# Patient Record
Sex: Male | Born: 1945 | Race: White | Hispanic: No | Marital: Married
Health system: Southern US, Community
[De-identification: ages and names within clinical notes are randomized; demographics above are authoritative.]

## PROBLEM LIST (undated history)

## (undated) DIAGNOSIS — Z9109 Other allergy status, other than to drugs and biological substances: Secondary | ICD-10-CM

## (undated) DIAGNOSIS — I252 Old myocardial infarction: Secondary | ICD-10-CM

## (undated) DIAGNOSIS — I219 Acute myocardial infarction, unspecified: Secondary | ICD-10-CM

## (undated) DIAGNOSIS — I10 Essential (primary) hypertension: Secondary | ICD-10-CM

## (undated) DIAGNOSIS — I2510 Atherosclerotic heart disease of native coronary artery without angina pectoris: Secondary | ICD-10-CM

## (undated) HISTORY — PX: SALIVARY STONE REMOVAL: SHX5213

## (undated) HISTORY — PX: CARDIAC CATHETERIZATION: SHX172

---

## 2003-08-07 ENCOUNTER — Other Ambulatory Visit: Payer: Self-pay

## 2005-08-12 ENCOUNTER — Emergency Department: Payer: Self-pay | Admitting: Emergency Medicine

## 2007-08-31 ENCOUNTER — Emergency Department: Payer: Self-pay | Admitting: Emergency Medicine

## 2010-04-27 ENCOUNTER — Ambulatory Visit: Payer: Self-pay | Admitting: Internal Medicine

## 2019-12-04 ENCOUNTER — Other Ambulatory Visit: Payer: Self-pay

## 2019-12-04 ENCOUNTER — Ambulatory Visit: Payer: Medicare Other | Attending: Internal Medicine

## 2019-12-04 DIAGNOSIS — Z23 Encounter for immunization: Secondary | ICD-10-CM

## 2019-12-04 NOTE — Progress Notes (Signed)
   Covid-19 Vaccination Clinic  Name:  Douglas Figueroa    MRN: [id number] DOB: May 06, 1946  12/04/2019  Mr. Douglas Figueroa was observed post Covid-19 immunization for 15 minutes without incident. He was provided with Vaccine Information Sheet and instruction to access the V-Safe system.   Mr. Douglas Figueroa was instructed to call 911 with any severe reactions post vaccine: Marland Kitchen Difficulty breathing  . Swelling of face and throat  . A fast heartbeat  . A bad rash all over body  . Dizziness and weakness   Immunizations Administered    Name Date Dose VIS Date Route   Pfizer COVID-19 Vaccine 12/04/2019  9:07 AM 0.3 mL 09/12/2019 Intramuscular   Manufacturer: [employer], [employer]   Lot: [id number]   NDC: [address]-4

## 2019-12-27 ENCOUNTER — Ambulatory Visit: Payer: Medicare Other

## 2019-12-31 ENCOUNTER — Ambulatory Visit: Payer: Medicare Other | Attending: Internal Medicine

## 2019-12-31 DIAGNOSIS — Z23 Encounter for immunization: Secondary | ICD-10-CM

## 2019-12-31 NOTE — Progress Notes (Signed)
   Covid-19 Vaccination Clinic  Name:  Douglas Figueroa    MRN: [id number] DOB: [birth year]  12/31/2019  Mr. Douglas Figueroa was observed post Covid-19 immunization for 15 minutes without incident. He was provided with Vaccine Information Sheet and instruction to access the V-Safe system.   Douglas Figueroa was instructed to call 911 with any severe reactions post vaccine: Marland Kitchen Difficulty breathing  . Swelling of face and throat  . A fast heartbeat  . A bad rash all over body  . Dizziness and weakness   Immunizations Administered    Name Date Dose VIS Date Route   Pfizer COVID-19 Vaccine 12/31/2019  9:44 AM 0.3 mL 09/12/2019 Intramuscular   Manufacturer: [employer], [employer]   Lot: [id number]   NDC: [address]-3

## 2020-08-09 ENCOUNTER — Other Ambulatory Visit (HOSPITAL_COMMUNITY): Payer: Self-pay | Admitting: Orthopedic Surgery

## 2020-08-09 ENCOUNTER — Other Ambulatory Visit: Payer: Self-pay | Admitting: Orthopedic Surgery

## 2020-08-09 DIAGNOSIS — M4807 Spinal stenosis, lumbosacral region: Secondary | ICD-10-CM

## 2020-08-09 DIAGNOSIS — M5442 Lumbago with sciatica, left side: Secondary | ICD-10-CM

## 2020-08-23 ENCOUNTER — Other Ambulatory Visit: Payer: Self-pay

## 2020-08-23 ENCOUNTER — Ambulatory Visit
Admission: RE | Admit: 2020-08-23 | Discharge: 2020-08-23 | Disposition: A | Discharge status: Home / Self Care | Payer: Medicare Other | Source: Ambulatory Visit | Attending: Orthopedic Surgery | Admitting: Orthopedic Surgery

## 2020-08-23 DIAGNOSIS — M5442 Lumbago with sciatica, left side: Secondary | ICD-10-CM | POA: Diagnosis present

## 2020-08-23 DIAGNOSIS — M4807 Spinal stenosis, lumbosacral region: Secondary | ICD-10-CM | POA: Insufficient documentation

## 2022-02-01 ENCOUNTER — Ambulatory Visit
Admission: RE | Admit: 2022-02-01 | Discharge: 2022-02-01 | Disposition: A | Discharge status: Home / Self Care | Payer: Medicare Other | Source: Ambulatory Visit | Attending: Physician Assistant | Admitting: Physician Assistant

## 2022-02-01 ENCOUNTER — Other Ambulatory Visit: Payer: Self-pay | Admitting: Physician Assistant

## 2022-02-01 DIAGNOSIS — N492 Inflammatory disorders of scrotum: Secondary | ICD-10-CM | POA: Diagnosis not present

## 2022-02-02 ENCOUNTER — Ambulatory Visit: Payer: Self-pay | Admitting: Urology

## 2022-12-13 IMAGING — US US SCROTUM W/ DOPPLER COMPLETE
1 series · 15 of 25 positions shown · non-contrast
Comparison: None Available.

CLINICAL DATA: Pain and swelling

EXAM:
SCROTAL ULTRASOUND
DOPPLER ULTRASOUND OF THE TESTICLES
TECHNIQUE: Complete ultrasound examination of the testicles, epididymis, and
other scrotal structures was performed. Color and spectral Doppler
ultrasound were also utilized to evaluate blood flow to the
testicles.

[Series 1: us art/ven flow abd pelv doppl · 15 of 51 slices shown]
[im 1/51]
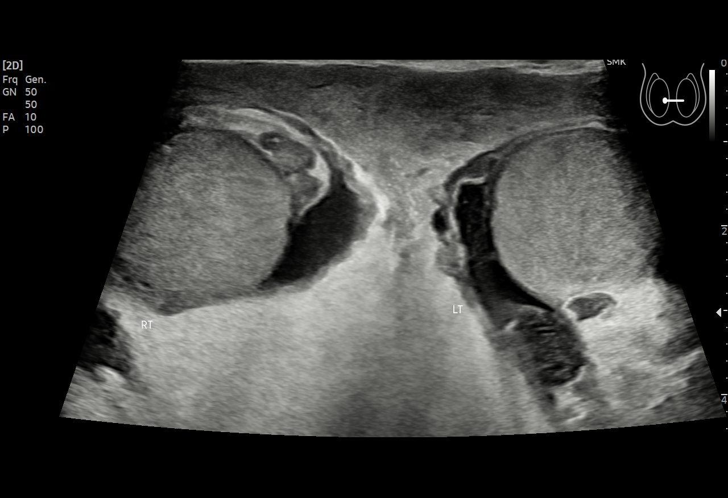
[im 5/51]
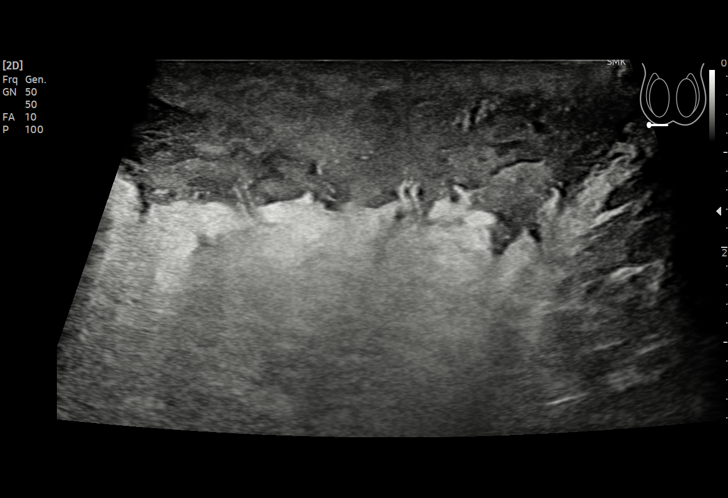
[im 9/51]
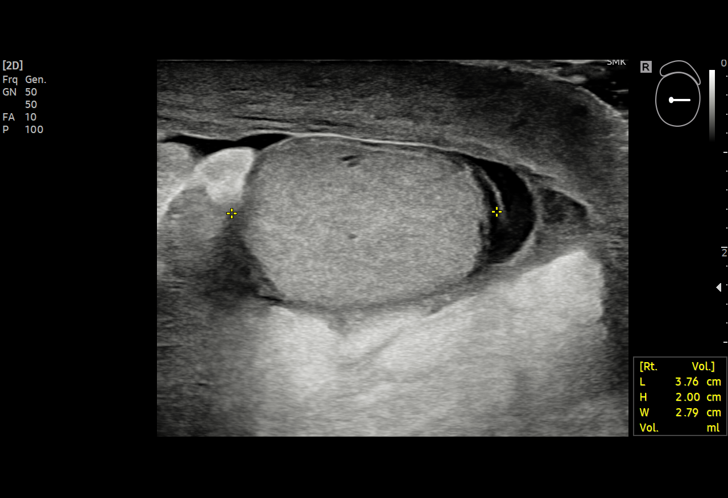
[im 11/51]
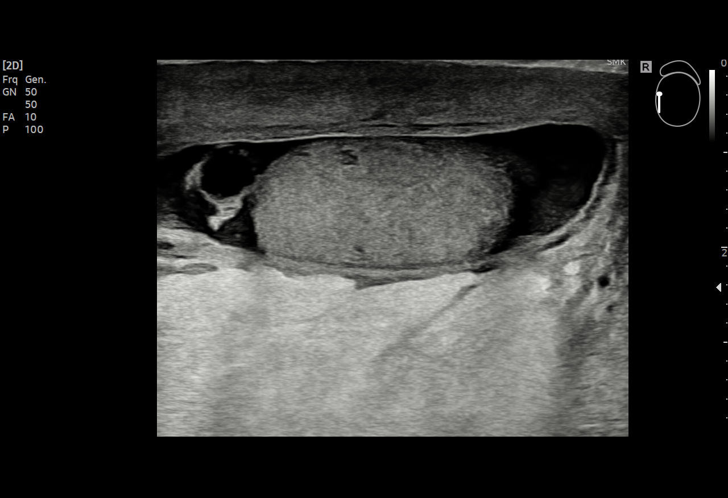
[im 15/51]
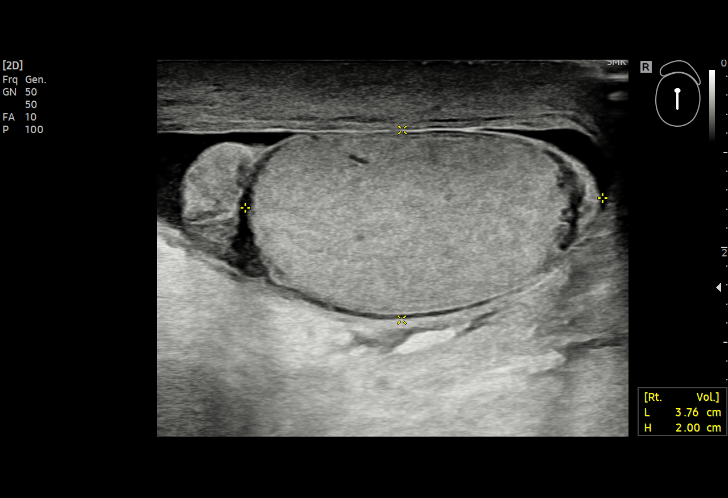
[im 19/51]
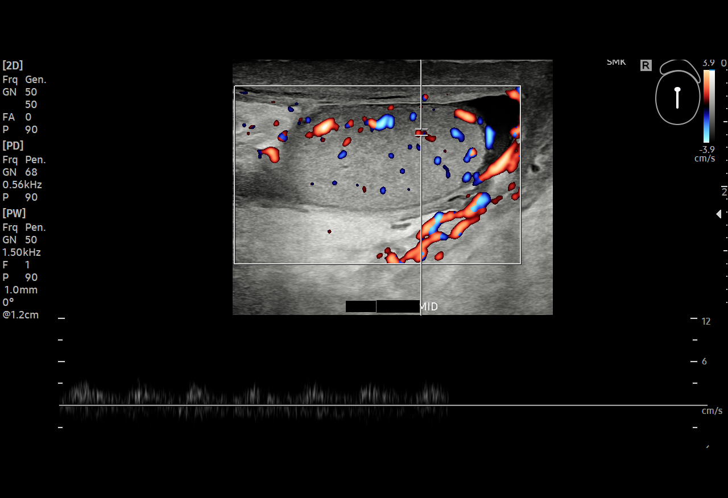
[im 21/51]
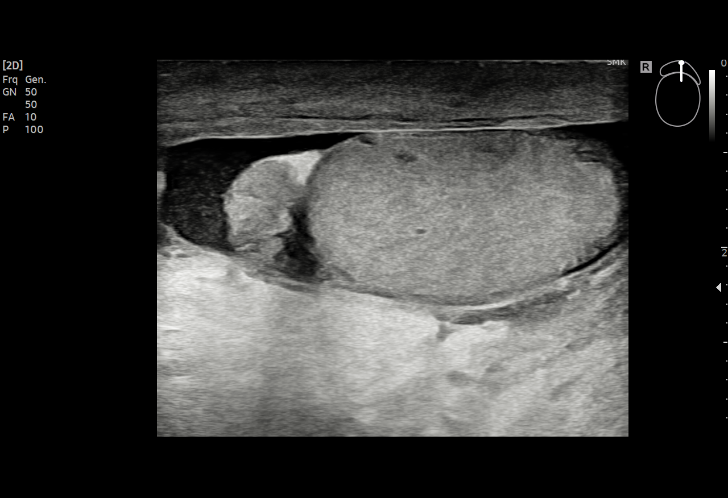
[im 26/51]
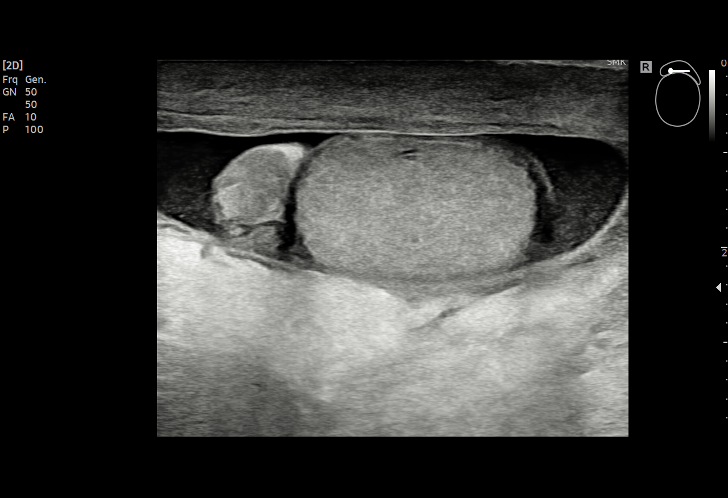
[im 30/51]
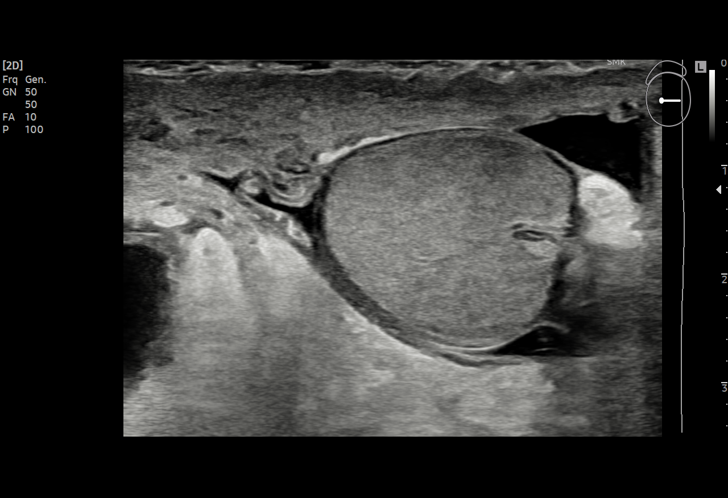
[im 32/51]
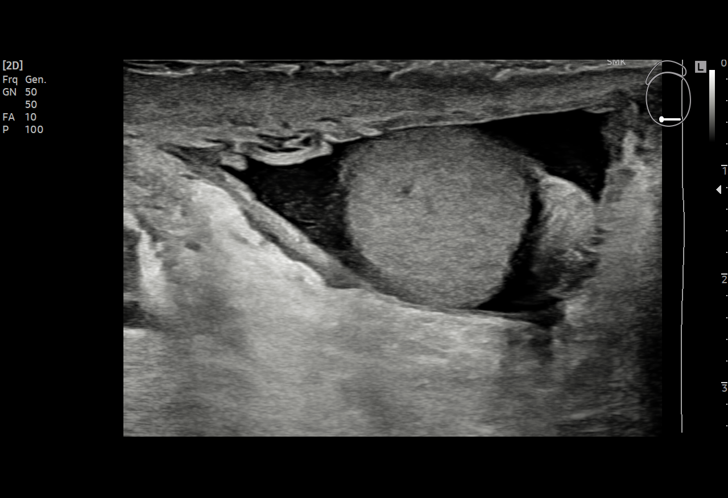
[im 36/51]
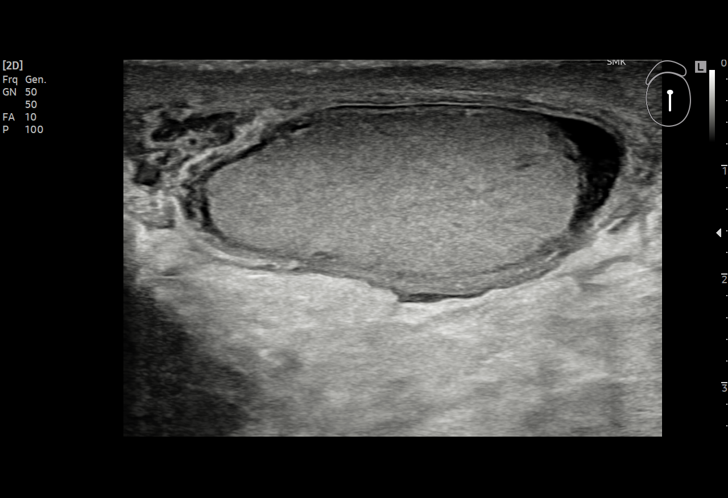
[im 40/51]
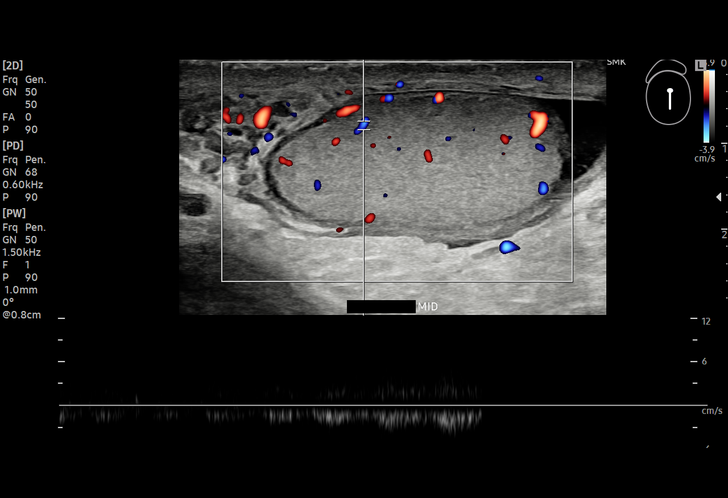
[im 42/51]
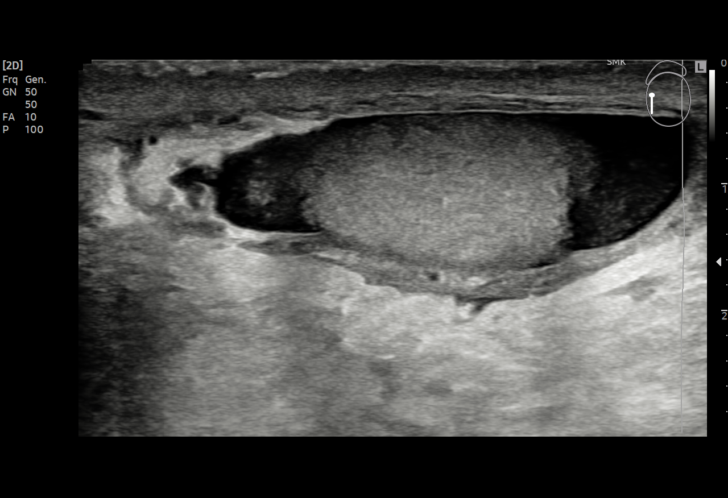
[im 46/51]
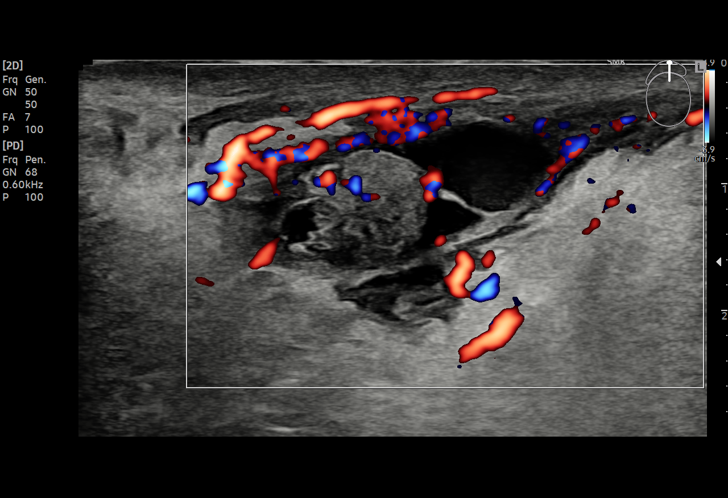
[im 51/51]
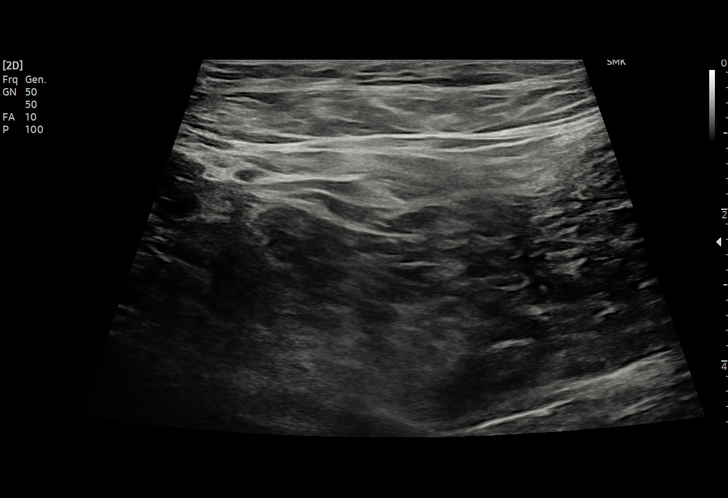

[15 of 25 positions shown; findings below may reference images not displayed]

FINDINGS: Right testicle

Measurements: 3.8 x 2 x 2.8 cm. No mass or microlithiasis
visualized.

Left testicle

Measurements: 3.5 x 1.5 x 2.3 cm. No mass or microlithiasis
visualized.

Right epididymis:  There is 8 mm cyst

Left epididymis:  There is 1.3 cm cyst

Hydrocele:  Small to moderate bilateral hydrocele is seen.

Varicocele:  None visualized.

Color Doppler examination was performed. Pulsed Doppler
interrogation of both testes demonstrates normal low resistance
arterial and venous waveforms bilaterally.

There is diffuse edema in the scrotal wall without any loculated
fluid collections. There is increased vascular flow in the scrotal
wall, possibly suggesting cellulitis.
IMPRESSION: There is no evidence of torsion in both testes. There is homogeneous
echogenicity in both testes.

Small to moderate bilateral hydrocele is present. Cysts are seen in
the epididymis on both sides.

There is diffuse subcutaneous edema in the scrotal wall suggesting
cellulitis. There is no loculated focal abscess in the scrotal wall.

## 2023-11-01 ENCOUNTER — Encounter: Payer: Self-pay | Admitting: Ophthalmology

## 2023-11-12 NOTE — Discharge Instructions (Signed)

## 2023-11-14 ENCOUNTER — Encounter
Admission: RE | Disposition: A | Discharge status: Home / Self Care | Payer: Self-pay | Source: Home / Self Care | Attending: Ophthalmology

## 2023-11-14 ENCOUNTER — Encounter: Payer: Self-pay | Admitting: Ophthalmology

## 2023-11-14 ENCOUNTER — Ambulatory Visit: Payer: Medicare Other | Admitting: Anesthesiology

## 2023-11-14 ENCOUNTER — Ambulatory Visit
Admission: RE | Admit: 2023-11-14 | Discharge: 2023-11-14 | Disposition: A | Discharge status: Home / Self Care | Payer: Medicare Other | Attending: Ophthalmology | Admitting: Ophthalmology

## 2023-11-14 ENCOUNTER — Other Ambulatory Visit: Payer: Self-pay

## 2023-11-14 DIAGNOSIS — I10 Essential (primary) hypertension: Secondary | ICD-10-CM | POA: Insufficient documentation

## 2023-11-14 DIAGNOSIS — I252 Old myocardial infarction: Secondary | ICD-10-CM | POA: Insufficient documentation

## 2023-11-14 DIAGNOSIS — I2510 Atherosclerotic heart disease of native coronary artery without angina pectoris: Secondary | ICD-10-CM | POA: Insufficient documentation

## 2023-11-14 DIAGNOSIS — H2512 Age-related nuclear cataract, left eye: Secondary | ICD-10-CM | POA: Insufficient documentation

## 2023-11-14 DIAGNOSIS — F17210 Nicotine dependence, cigarettes, uncomplicated: Secondary | ICD-10-CM | POA: Diagnosis not present

## 2023-11-14 HISTORY — DX: Essential (primary) hypertension: I10

## 2023-11-14 HISTORY — DX: Acute myocardial infarction, unspecified: I21.9

## 2023-11-14 HISTORY — DX: Other allergy status, other than to drugs and biological substances: Z91.09

## 2023-11-14 HISTORY — DX: Old myocardial infarction: I25.2

## 2023-11-14 HISTORY — PX: CATARACT EXTRACTION W/PHACO: SHX586

## 2023-11-14 HISTORY — DX: Atherosclerotic heart disease of native coronary artery without angina pectoris: I25.10

## 2023-11-14 SURGERY — PHACOEMULSIFICATION, CATARACT, WITH IOL INSERTION
Anesthesia: Monitor Anesthesia Care | Site: Eye | Laterality: Left

## 2023-11-14 MED ORDER — SIGHTPATH DOSE#1 BSS IO SOLN
INTRAOCULAR | Status: DC | PRN
  Administered 2023-11-14: 2 mL

## 2023-11-14 MED ORDER — FENTANYL CITRATE (PF) 100 MCG/2ML IJ SOLN
INTRAMUSCULAR | Status: AC
  Filled 2023-11-14: qty 2

## 2023-11-14 MED ORDER — SIGHTPATH DOSE#1 NA HYALUR & NA CHOND-NA HYALUR IO KIT
PACK | INTRAOCULAR | Status: DC | PRN
  Administered 2023-11-14: 1 via OPHTHALMIC

## 2023-11-14 MED ORDER — BRIMONIDINE TARTRATE-TIMOLOL 0.2-0.5 % OP SOLN
OPHTHALMIC | Status: DC | PRN
  Administered 2023-11-14: 1 [drp] via OPHTHALMIC

## 2023-11-14 MED ORDER — MIDAZOLAM HCL 2 MG/2ML IJ SOLN
INTRAMUSCULAR | Status: DC | PRN
  Administered 2023-11-14: 1 mg via INTRAVENOUS

## 2023-11-14 MED ORDER — TETRACAINE HCL 0.5 % OP SOLN
1.0000 [drp] | OPHTHALMIC | Status: DC | PRN
  Administered 2023-11-14 (×3): 1 [drp] via OPHTHALMIC

## 2023-11-14 MED ORDER — SIGHTPATH DOSE#1 BSS IO SOLN
INTRAOCULAR | Status: DC | PRN
  Administered 2023-11-14: 85 mL via OPHTHALMIC

## 2023-11-14 MED ORDER — MIDAZOLAM HCL 2 MG/2ML IJ SOLN
INTRAMUSCULAR | Status: AC
  Filled 2023-11-14: qty 2

## 2023-11-14 MED ORDER — SIGHTPATH DOSE#1 BSS IO SOLN
INTRAOCULAR | Status: DC | PRN
  Administered 2023-11-14: 15 mL via INTRAOCULAR

## 2023-11-14 MED ORDER — CEFUROXIME OPHTHALMIC INJECTION 1 MG/0.1 ML
INJECTION | OPHTHALMIC | Status: DC | PRN
  Administered 2023-11-14: 1 mg via INTRACAMERAL

## 2023-11-14 MED ORDER — TETRACAINE HCL 0.5 % OP SOLN
OPHTHALMIC | Status: AC
  Filled 2023-11-14: qty 4

## 2023-11-14 MED ORDER — FENTANYL CITRATE (PF) 100 MCG/2ML IJ SOLN
INTRAMUSCULAR | Status: DC | PRN
  Administered 2023-11-14: 50 ug via INTRAVENOUS

## 2023-11-14 MED ORDER — ARMC OPHTHALMIC DILATING DROPS
OPHTHALMIC | Status: AC
  Filled 2023-11-14: qty 0.5

## 2023-11-14 MED ORDER — ARMC OPHTHALMIC DILATING DROPS
1.0000 | OPHTHALMIC | Status: DC | PRN
  Administered 2023-11-14 (×3): 1 via OPHTHALMIC

## 2023-11-14 SURGICAL SUPPLY — 8 items
CATARACT SUITE SIGHTPATH (MISCELLANEOUS) ×1
FEE CATARACT SUITE SIGHTPATH (MISCELLANEOUS) ×1 IMPLANT
GLOVE SRG 8 PF TXTR STRL LF DI (GLOVE) ×1 IMPLANT
GLOVE SURG ENC TEXT LTX SZ7.5 (GLOVE) ×1 IMPLANT
LENS IOL TECNIS EYHANCE 18.5 (Intraocular Lens) IMPLANT
NDL FILTER BLUNT 18X1 1/2 (NEEDLE) ×1 IMPLANT
NEEDLE FILTER BLUNT 18X1 1/2 (NEEDLE) ×1
SYR 3ML LL SCALE MARK (SYRINGE) ×1 IMPLANT

## 2023-11-14 NOTE — Anesthesia Postprocedure Evaluation (Signed)
Anesthesia Post Note  Patient: Douglas Figueroa  Procedure(s) Performed: CATARACT EXTRACTION PHACO AND INTRAOCULAR LENS PLACEMENT (IOC) LEFT 10.99 00:43.7 (Left: Eye)  Patient location during evaluation: PACU Anesthesia Type: MAC Level of consciousness: awake and alert Pain management: pain level controlled Vital Signs Assessment: post-procedure vital signs reviewed and stable Respiratory status: spontaneous breathing, nonlabored ventilation, respiratory function stable and patient connected to nasal cannula oxygen Cardiovascular status: stable and blood pressure returned to baseline Postop Assessment: no apparent nausea or vomiting Anesthetic complications: no   No notable events documented.   Last Vitals:  Vitals:   11/14/23 1032 11/14/23 1034  BP:  116/72  Pulse: (!) 56 (!) 59  Resp: 13 14  Temp:    SpO2: 97% 95%    Last Pain:  Vitals:   11/14/23 1034  TempSrc:   PainSc: 0-No pain                 Harolyn Cocker C Analyssa Downs

## 2023-11-14 NOTE — Anesthesia Preprocedure Evaluation (Signed)
Anesthesia Evaluation  Patient identified by MRN, date of birth, ID band Patient awake    Reviewed: Allergy & Precautions, H&P , NPO status , Patient's Chart, lab work & pertinent test results  Airway Mallampati: III  TM Distance: >3 FB Neck ROM: Full    Dental no notable dental hx. (+) Poor Dentition, Chipped   Pulmonary neg pulmonary ROS, Current Smoker and Patient abstained from smoking.   Pulmonary exam normal breath sounds clear to auscultation       Cardiovascular hypertension, + CAD and + Past MI  negative cardio ROS Normal cardiovascular exam Rhythm:Regular Rate:Normal   ETT Myoview on 10/25/2021, exercising for 4 minutes on a Bruce protocol without chest pain or ischemic ECG changes. Gated scintigraphy revealed LVEF 55%. SPECT imaging revealed no evidence for ischemia, but did show a mild inferior scar.    Neuro/Psych negative neurological ROS  negative psych ROS   GI/Hepatic negative GI ROS, Neg liver ROS,,,  Endo/Other  negative endocrine ROS    Renal/GU negative Renal ROS  negative genitourinary   Musculoskeletal negative musculoskeletal ROS (+)    Abdominal   Peds negative pediatric ROS (+)  Hematology negative hematology ROS (+)   Anesthesia Other Findings Hypertension  Myocardial infarction (HCC) Environmental allergies  CAD S/P percutaneous coronary angioplasty History of ST elevation myocardial infarction (STEMI)     Reproductive/Obstetrics negative OB ROS                              Anesthesia Physical Anesthesia Plan  ASA: 3  Anesthesia Plan: MAC   Post-op Pain Management:    Induction: Intravenous  PONV Risk Score and Plan:   Airway Management Planned: Natural Airway and Nasal Cannula  Additional Equipment:   Intra-op Plan:   Post-operative Plan:   Informed Consent: I have reviewed the patients History and Physical, chart, labs and discussed the  procedure including the risks, benefits and alternatives for the proposed anesthesia with the patient or authorized representative who has indicated his/her understanding and acceptance.     Dental Advisory Given  Plan Discussed with: Anesthesiologist, CRNA and Surgeon  Anesthesia Plan Comments: (Patient consented for risks of anesthesia including but not limited to:  - adverse reactions to medications - damage to eyes, teeth, lips or other oral mucosa - nerve damage due to positioning  - sore throat or hoarseness - Damage to heart, brain, nerves, lungs, other parts of body or loss of life  Patient voiced understanding and assent.)         Anesthesia Quick Evaluation

## 2023-11-14 NOTE — Transfer of Care (Signed)
Immediate Anesthesia Transfer of Care Note  Patient: Douglas Figueroa  Procedure(s) Performed: CATARACT EXTRACTION PHACO AND INTRAOCULAR LENS PLACEMENT (IOC) LEFT 10.99 00:43.7 (Left: Eye)  Patient Location: PACU  Anesthesia Type: MAC  Level of Consciousness: awake, alert  and patient cooperative  Airway and Oxygen Therapy: Patient Spontanous Breathing and Patient connected to supplemental oxygen  Post-op Assessment: Post-op Vital signs reviewed, Patient's Cardiovascular Status Stable, Respiratory Function Stable, Patent Airway and No signs of Nausea or vomiting  Post-op Vital Signs: Reviewed and stable  Complications: No notable events documented.

## 2023-11-14 NOTE — Op Note (Signed)
OPERATIVE NOTE  Douglas Figueroa [id number] 11/14/2023   PREOPERATIVE DIAGNOSIS:  Nuclear sclerotic cataract left eye. H25.12   POSTOPERATIVE DIAGNOSIS:    Nuclear sclerotic cataract left eye.     PROCEDURE:  Phacoemusification with posterior chamber intraocular lens placement of the left eye  Ultrasound time: Procedure(s): CATARACT EXTRACTION PHACO AND INTRAOCULAR LENS PLACEMENT (IOC) LEFT 10.99 00:43.7 (Left)  LENS:   Implant Name Type Inv. Item Serial No. Manufacturer Lot No. LRB No. Used Action  LENS IOL TECNIS EYHANCE 18.5 - [id number] Intraocular Lens LENS IOL TECNIS EYHANCE 18.5 [id number] [employer]  Left 1 Implanted      SURGEON:  Douglas Evener, MD   ANESTHESIA:  Topical with tetracaine drops and 2% Xylocaine jelly, augmented with 1% preservative-free intracameral lidocaine.    COMPLICATIONS:  None.   DESCRIPTION OF PROCEDURE:  The patient was identified in the holding room and transported to the operating room and placed in the supine position under the operating microscope.  The left eye was identified as the operative eye and it was prepped and draped in the usual sterile ophthalmic fashion.   A 1 millimeter clear-corneal paracentesis was made at the 1:30 position.  0.5 ml of preservative-free 1% lidocaine was injected into the anterior chamber.  The anterior chamber was filled with Viscoat viscoelastic.  A 2.4 millimeter keratome was used to make a near-clear corneal incision at the 10:30 position.  .  A curvilinear capsulorrhexis was made with a cystotome and capsulorrhexis forceps.  Balanced salt solution was used to hydrodissect and hydrodelineate the nucleus.   Phacoemulsification was then used in stop and chop fashion to remove the lens nucleus and epinucleus.  The remaining cortex was then removed using the irrigation and aspiration handpiece. Provisc was then placed into the capsular bag to distend it for lens placement.  A lens was then injected into the  capsular bag.  The remaining viscoelastic was aspirated.   Wounds were hydrated with balanced salt solution.  The anterior chamber was inflated to a physiologic pressure with balanced salt solution.  No wound leaks were noted. Cefuroxime 0.1 ml of a 10mg /ml solution was injected into the anterior chamber for a dose of 1 mg of intracameral antibiotic at the completion of the case.   Timolol and Brimonidine drops were applied to the eye.  The patient was taken to the recovery room in stable condition without complications of anesthesia or surgery.  [name] 11/14/2023, 10:30 AM

## 2023-11-14 NOTE — H&P (Signed)
[facility]   Primary Care Physician:  Marisue Ivan, MD Ophthalmologist: Dr. Lockie Mola  Pre-Procedure History & Physical: HPI:  Douglas Figueroa is a 78 y.o. male here for ophthalmic surgery.   Past Medical History:  Diagnosis Date   CAD S/P percutaneous coronary angioplasty    Environmental allergies    History of ST elevation myocardial infarction (STEMI)    Hypertension    Myocardial infarction [facility])    STEMI - 2000, 2002    Past Surgical History:  Procedure Laterality Date   CARDIAC CATHETERIZATION     2000, 2002 . stents placed   SALIVARY STONE REMOVAL      Prior to Admission medications   Medication Sig Start Date End Date Taking? Authorizing Provider  adalimumab (HUMIRA, 2 PEN,) 40 MG/0.4ML pen Inject into the skin every 14 (fourteen) days.   Yes [provider]  aspirin EC 81 MG tablet Take 81 mg by mouth in the morning and at bedtime. Swallow whole.   Yes [provider]  augmented betamethasone dipropionate (DIPROLENE-AF) 0.05 % ointment Apply topically 2 (two) times daily.   Yes [provider]  captopril (CAPOTEN) 12.5 MG tablet Take 12.5 mg by mouth 2 (two) times daily.   Yes [provider]  fluocinonide (LIDEX) 0.05 % external solution Apply 1 Application topically 2 (two) times daily.   Yes [provider]  KETOCONAZOLE EX Apply topically in the morning and at bedtime.   Yes [provider]  metoprolol tartrate (LOPRESSOR) 25 MG tablet Take 25 mg by mouth 2 (two) times daily.   Yes [provider]  nitroGLYCERIN (NITROSTAT) 0.4 MG SL tablet Place 0.4 mg under the tongue every 5 (five) minutes as needed for chest pain.   Yes [provider]  simvastatin (ZOCOR) 40 MG tablet Take 40 mg by mouth daily. Patient not taking: Reported on 11/14/2023    [provider]    Allergies as of 10/18/2023   (Not on File)    History reviewed. No pertinent family  history.  Social History   Socioeconomic History   Marital status: Married    Spouse name: Not on file   Number of children: Not on file   Years of education: Not on file   Highest education level: Not on file  Occupational History   Not on file  Tobacco Use   Smoking status: Some Days    Current packs/day: 0.10    Average packs/day: 0.9 packs/day for 60.1 years (53.7 ttl pk-yrs)    Types: Cigarettes    Start date: 1965   Smokeless tobacco: Never  Vaping Use   Vaping status: Never Used  Substance and Sexual Activity   Alcohol use: Yes    Comment: Rare   Drug use: Not on file   Sexual activity: Not on file  Other Topics Concern   Not on file  Social History Narrative   Not on file   Social Drivers of Health   Financial Resource Strain: Not on file  Food Insecurity: Not on file  Transportation Needs: Not on file  Physical Activity: Not on file  Stress: Not on file  Social Connections: Not on file  Intimate Partner Violence: Not on file    Review of Systems: See HPI, otherwise negative ROS  Physical Exam: BP 115/66   Pulse (!) 59   Temp (!) 97.3 F (36.3 C) (Temporal)   Resp 13   Ht 6' 0.01" (1.829 m)   Wt 79.8 kg  SpO2 94%   BMI 23.86 kg/m  General:   Alert,  pleasant and cooperative in NAD Head:  Normocephalic and atraumatic. Lungs:  Clear to auscultation.    Heart:  Regular rate and rhythm.   Impression/Plan: Douglas Figueroa is here for ophthalmic surgery.  Risks, benefits, limitations, and alternatives regarding ophthalmic surgery have been reviewed with the patient.  Questions have been answered.  All parties agreeable.   Lockie Mola, MD  11/14/2023, 9:16 AM

## 2023-11-15 ENCOUNTER — Encounter: Payer: Self-pay | Admitting: Ophthalmology

## 2023-11-15 DIAGNOSIS — H2512 Age-related nuclear cataract, left eye: Secondary | ICD-10-CM | POA: Diagnosis not present

## 2023-11-21 NOTE — Anesthesia Preprocedure Evaluation (Addendum)
 Anesthesia Evaluation  Patient identified by MRN, date of birth, ID band Patient awake    Reviewed: Allergy & Precautions, H&P , NPO status , Patient's Chart, lab work & pertinent test results  Airway Mallampati: III  TM Distance: >3 FB Neck ROM: Full    Dental no notable dental hx. (+) Chipped, Poor Dentition   Pulmonary neg pulmonary ROS, Current Smoker and Patient abstained from smoking.   Pulmonary exam normal breath sounds clear to auscultation       Cardiovascular hypertension, + CAD and + Past MI  negative cardio ROS Normal cardiovascular exam Rhythm:Regular Rate:Normal  ETT Myoview on 10/25/2021, exercising for 4 minutes on a Bruce protocol without chest pain or ischemic ECG changes. Gated scintigraphy revealed LVEF 55%. SPECT imaging revealed no evidence for ischemia, but did show a mild inferior scar.        Neuro/Psych negative neurological ROS  negative psych ROS   GI/Hepatic negative GI ROS, Neg liver ROS,,,  Endo/Other  negative endocrine ROS    Renal/GU negative Renal ROS  negative genitourinary   Musculoskeletal negative musculoskeletal ROS (+)    Abdominal   Peds negative pediatric ROS (+)  Hematology negative hematology ROS (+)   Anesthesia Other Findings Previous cataract surgery 11-14-23 Dr. Juel Burrow anesthesiologist  Hypertension  Myocardial infarction [facility]) Environmental allergies  CAD  S/P percutaneous coronary angioplasty History of ST elevation myocardial infarction (STEMI)     Reproductive/Obstetrics negative OB ROS                             Anesthesia Physical Anesthesia Plan  ASA: 3  Anesthesia Plan: MAC   Post-op Pain Management:    Induction: Intravenous  PONV Risk Score and Plan:   Airway Management Planned: Natural Airway and Nasal Cannula  Additional Equipment:   Intra-op Plan:   Post-operative Plan:   Informed Consent: I have  reviewed the patients History and Physical, chart, labs and discussed the procedure including the risks, benefits and alternatives for the proposed anesthesia with the patient or authorized representative who has indicated his/her understanding and acceptance.     Dental Advisory Given  Plan Discussed with: Anesthesiologist, CRNA and Surgeon  Anesthesia Plan Comments: (Patient consented for risks of anesthesia including but not limited to:  - adverse reactions to medications - damage to eyes, teeth, lips or other oral mucosa - nerve damage due to positioning  - sore throat or hoarseness - Damage to heart, brain, nerves, lungs, other parts of body or loss of life  Patient voiced understanding and assent.)       Anesthesia Quick Evaluation

## 2023-12-03 NOTE — Discharge Instructions (Signed)

## 2023-12-05 ENCOUNTER — Ambulatory Visit: Payer: Self-pay | Admitting: Anesthesiology

## 2023-12-05 ENCOUNTER — Other Ambulatory Visit: Payer: Self-pay

## 2023-12-05 ENCOUNTER — Encounter: Payer: Self-pay | Admitting: Ophthalmology

## 2023-12-05 ENCOUNTER — Ambulatory Visit
Admission: RE | Admit: 2023-12-05 | Discharge: 2023-12-05 | Disposition: A | Discharge status: Home / Self Care | Payer: Medicare Other | Attending: Ophthalmology | Admitting: Ophthalmology

## 2023-12-05 ENCOUNTER — Encounter
Admission: RE | Disposition: A | Discharge status: Home / Self Care | Payer: Self-pay | Source: Home / Self Care | Attending: Ophthalmology

## 2023-12-05 DIAGNOSIS — I10 Essential (primary) hypertension: Secondary | ICD-10-CM | POA: Diagnosis not present

## 2023-12-05 DIAGNOSIS — H2511 Age-related nuclear cataract, right eye: Secondary | ICD-10-CM | POA: Diagnosis present

## 2023-12-05 DIAGNOSIS — I252 Old myocardial infarction: Secondary | ICD-10-CM | POA: Insufficient documentation

## 2023-12-05 DIAGNOSIS — Z955 Presence of coronary angioplasty implant and graft: Secondary | ICD-10-CM | POA: Diagnosis not present

## 2023-12-05 DIAGNOSIS — I2510 Atherosclerotic heart disease of native coronary artery without angina pectoris: Secondary | ICD-10-CM | POA: Diagnosis not present

## 2023-12-05 DIAGNOSIS — F17210 Nicotine dependence, cigarettes, uncomplicated: Secondary | ICD-10-CM | POA: Insufficient documentation

## 2023-12-05 HISTORY — PX: CATARACT EXTRACTION W/PHACO: SHX586

## 2023-12-05 SURGERY — PHACOEMULSIFICATION, CATARACT, WITH IOL INSERTION
Anesthesia: Monitor Anesthesia Care | Site: Eye | Laterality: Right

## 2023-12-05 MED ORDER — BRIMONIDINE TARTRATE-TIMOLOL 0.2-0.5 % OP SOLN
OPHTHALMIC | Status: DC | PRN
  Administered 2023-12-05: 1 [drp] via OPHTHALMIC

## 2023-12-05 MED ORDER — ARMC OPHTHALMIC DILATING DROPS
1.0000 | OPHTHALMIC | Status: DC | PRN
  Administered 2023-12-05 (×3): 1 via OPHTHALMIC

## 2023-12-05 MED ORDER — MIDAZOLAM HCL 2 MG/2ML IJ SOLN
INTRAMUSCULAR | Status: AC
Start: 2023-12-05 — End: ?
  Filled 2023-12-05: qty 2

## 2023-12-05 MED ORDER — MIDAZOLAM HCL 2 MG/2ML IJ SOLN
INTRAMUSCULAR | Status: DC | PRN
  Administered 2023-12-05: 1 mg via INTRAVENOUS

## 2023-12-05 MED ORDER — SIGHTPATH DOSE#1 NA HYALUR & NA CHOND-NA HYALUR IO KIT
PACK | INTRAOCULAR | Status: DC | PRN
  Administered 2023-12-05: 1 via OPHTHALMIC

## 2023-12-05 MED ORDER — ARMC OPHTHALMIC DILATING DROPS
OPHTHALMIC | Status: AC
  Filled 2023-12-05: qty 0.5

## 2023-12-05 MED ORDER — CEFUROXIME OPHTHALMIC INJECTION 1 MG/0.1 ML
INJECTION | OPHTHALMIC | Status: DC | PRN
  Administered 2023-12-05: .1 mL via INTRACAMERAL

## 2023-12-05 MED ORDER — FENTANYL CITRATE (PF) 100 MCG/2ML IJ SOLN
INTRAMUSCULAR | Status: AC
  Filled 2023-12-05: qty 2

## 2023-12-05 MED ORDER — SIGHTPATH DOSE#1 BSS IO SOLN
INTRAOCULAR | Status: DC | PRN
  Administered 2023-12-05: 77 mL via OPHTHALMIC

## 2023-12-05 MED ORDER — TETRACAINE HCL 0.5 % OP SOLN
OPHTHALMIC | Status: AC
  Filled 2023-12-05: qty 4

## 2023-12-05 MED ORDER — TETRACAINE HCL 0.5 % OP SOLN
1.0000 [drp] | OPHTHALMIC | Status: DC | PRN
  Administered 2023-12-05 (×3): 1 [drp] via OPHTHALMIC

## 2023-12-05 MED ORDER — FENTANYL CITRATE (PF) 100 MCG/2ML IJ SOLN
INTRAMUSCULAR | Status: DC | PRN
  Administered 2023-12-05: 50 ug via INTRAVENOUS

## 2023-12-05 MED ORDER — SIGHTPATH DOSE#1 BSS IO SOLN
INTRAOCULAR | Status: DC | PRN
  Administered 2023-12-05: 1 mL

## 2023-12-05 MED ORDER — SIGHTPATH DOSE#1 BSS IO SOLN
INTRAOCULAR | Status: DC | PRN
  Administered 2023-12-05: 15 mL

## 2023-12-05 SURGICAL SUPPLY — 20 items
BNDG EYE OVAL 2 1/8 X 2 5/8 (GAUZE/BANDAGES/DRESSINGS) IMPLANT
CANNULA ANT/CHMB 27G (MISCELLANEOUS) IMPLANT
CANNULA ANT/CHMB 27GA (MISCELLANEOUS) IMPLANT
CATARACT SUITE SIGHTPATH (MISCELLANEOUS) ×1 IMPLANT
FEE CATARACT SUITE SIGHTPATH (MISCELLANEOUS) ×1 IMPLANT
GLOVE BIOGEL PI IND STRL 8 (GLOVE) ×1 IMPLANT
GLOVE PI ULTRA LF STRL 7.5 (GLOVE) IMPLANT
GLOVE SURG LX STRL 7.5 STRW (GLOVE) ×1 IMPLANT
GLOVE SURG PROTEXIS BL SZ6.5 (GLOVE) ×1 IMPLANT
GLOVE SURG SYN 6.5 PF PI BL (GLOVE) ×1 IMPLANT
LENS IOL TECNIS EYHANCE 18.5 (Intraocular Lens) IMPLANT
NDL FILTER BLUNT 18X1 1/2 (NEEDLE) ×1 IMPLANT
NDL RETROBULBAR .5 NSTRL (NEEDLE) IMPLANT
NEEDLE FILTER BLUNT 18X1 1/2 (NEEDLE) ×1 IMPLANT
PACK VIT ANT 23G (MISCELLANEOUS) IMPLANT
RING MALYGIN 7.0 (MISCELLANEOUS) IMPLANT
SUT ETHILON 10-0 CS-B-6CS-B-6 (SUTURE) IMPLANT
SUT VICRYL 9 0 (SUTURE) IMPLANT
SUTURE EHLN 10-0 CS-B-6CS-B-6 (SUTURE) IMPLANT
SYR 3ML LL SCALE MARK (SYRINGE) ×1 IMPLANT

## 2023-12-05 NOTE — Anesthesia Postprocedure Evaluation (Signed)
 Anesthesia Post Note  Patient: Douglas Figueroa  Procedure(s) Performed: CATARACT EXTRACTION PHACO AND INTRAOCULAR LENS PLACEMENT (IOC) RIGHT (Right: Eye)  Patient location during evaluation: PACU Anesthesia Type: MAC Level of consciousness: awake and alert Pain management: pain level controlled Vital Signs Assessment: post-procedure vital signs reviewed and stable Respiratory status: spontaneous breathing, nonlabored ventilation, respiratory function stable and patient connected to nasal cannula oxygen Cardiovascular status: stable and blood pressure returned to baseline Postop Assessment: no apparent nausea or vomiting Anesthetic complications: no   No notable events documented.   Last Vitals:  Vitals:   12/05/23 0820 12/05/23 0825  BP: 114/70 119/69  Pulse: (!) 58 (!) 59  Resp: 13 14  Temp: (!) 36.4 C 36.4 C  SpO2: 97% 97%    Last Pain:  Vitals:   12/05/23 0825  TempSrc:   PainSc: 0-No pain                 [place] Kuang C China Deitrick

## 2023-12-05 NOTE — H&P (Signed)
 [facility]   Primary Care Physician:  Marisue Ivan, MD Ophthalmologist: Dr. Lockie Mola  Pre-Procedure History & Physical: HPI:  ISAISH ALEMU is a 78 y.o. male here for ophthalmic surgery.   Past Medical History:  Diagnosis Date   CAD S/P percutaneous coronary angioplasty    Environmental allergies    History of ST elevation myocardial infarction (STEMI)    Hypertension    Myocardial infarction [facility])    STEMI - 2000, 2002    Past Surgical History:  Procedure Laterality Date   CARDIAC CATHETERIZATION     2000, 2002 . stents placed   CATARACT EXTRACTION W/PHACO Left 11/14/2023   Procedure: CATARACT EXTRACTION PHACO AND INTRAOCULAR LENS PLACEMENT (IOC) LEFT 10.99 00:43.7;  Surgeon: Lockie Mola, MD;  [facility];  Service: Ophthalmology;  Laterality: Left;   SALIVARY STONE REMOVAL      Prior to Admission medications   Medication Sig Start Date End Date Taking? Authorizing Provider  aspirin EC 81 MG tablet Take 81 mg by mouth in the morning and at bedtime. Swallow whole.   Yes [provider]  augmented betamethasone dipropionate (DIPROLENE-AF) 0.05 % ointment Apply topically 2 (two) times daily.   Yes [provider]  captopril (CAPOTEN) 12.5 MG tablet Take 12.5 mg by mouth 2 (two) times daily.   Yes [provider]  fluocinonide (LIDEX) 0.05 % external solution Apply 1 Application topically 2 (two) times daily.   Yes [provider]  KETOCONAZOLE EX Apply topically in the morning and at bedtime.   Yes [provider]  metoprolol tartrate (LOPRESSOR) 25 MG tablet Take 25 mg by mouth 2 (two) times daily.   Yes [provider]  nitroGLYCERIN (NITROSTAT) 0.4 MG SL tablet Place 0.4 mg under the tongue every 5 (five) minutes as needed for chest pain.   Yes [provider]  simvastatin (ZOCOR) 40 MG tablet Take 40 mg by mouth daily.   Yes [provider]  adalimumab  (HUMIRA, 2 PEN,) 40 MG/0.4ML pen Inject into the skin every 14 (fourteen) days.    [provider]    Allergies as of 10/18/2023   (Not on File)    History reviewed. No pertinent family history.  Social History   Socioeconomic History   Marital status: Married    Spouse name: Not on file   Number of children: Not on file   Years of education: Not on file   Highest education level: Not on file  Occupational History   Not on file  Tobacco Use   Smoking status: Some Days    Current packs/day: 0.10    Average packs/day: 0.9 packs/day for 60.2 years (53.7 ttl pk-yrs)    Types: Cigarettes    Start date: 1965   Smokeless tobacco: Never  Vaping Use   Vaping status: Never Used  Substance and Sexual Activity   Alcohol use: Yes    Comment: Rare   Drug use: Not on file   Sexual activity: Not on file  Other Topics Concern   Not on file  Social History Narrative   Not on file   Social Drivers of Health   Financial Resource Strain: Not on file  Food Insecurity: Not on file  Transportation Needs: Not on file  Physical Activity: Not on file  Stress: Not on file  Social Connections: Not on file  Intimate Partner Violence: Not on file    Review of Systems: See HPI, otherwise negative ROS  Physical Exam: BP  103/60   Pulse 64   Temp 98.5 F (36.9 C) (Temporal)   Resp 17   Ht 6' (1.829 m)   Wt 80.6 kg   SpO2 94%   BMI 24.10 kg/m  General:   Alert,  pleasant and cooperative in NAD Head:  Normocephalic and atraumatic. Lungs:  Clear to auscultation.    Heart:  Regular rate and rhythm.   Impression/Plan: BENICIO MANNA is here for ophthalmic surgery.  Risks, benefits, limitations, and alternatives regarding ophthalmic surgery have been reviewed with the patient.  Questions have been answered.  All parties agreeable.   Lockie Mola, MD  12/05/2023, 7:33 AM

## 2023-12-05 NOTE — Op Note (Signed)
 LOCATION:  [facility]   PREOPERATIVE DIAGNOSIS:    Nuclear sclerotic cataract right eye. H25.11   POSTOPERATIVE DIAGNOSIS:  Nuclear sclerotic cataract right eye.     PROCEDURE:  Phacoemusification with posterior chamber intraocular lens placement of the right eye   ULTRASOUND TIME: Procedure(s) with comments: CATARACT EXTRACTION PHACO AND INTRAOCULAR LENS PLACEMENT (IOC) RIGHT (Right) - 11.83 0:52.2  LENS:   Implant Name Type Inv. Item Serial No. Manufacturer Lot No. LRB No. Used Action  LENS IOL TECNIS EYHANCE 18.5 - [id number] Intraocular Lens LENS IOL TECNIS EYHANCE 18.5 [id number] [employer]  Right 1 Implanted         SURGEON:  Deirdre Evener, MD   ANESTHESIA:  Topical with tetracaine drops and 2% Xylocaine jelly, augmented with 1% preservative-free intracameral lidocaine.    COMPLICATIONS:  None.   DESCRIPTION OF PROCEDURE:  The patient was identified in the holding room and transported to the operating room and placed in the supine position under the operating microscope.  The right eye was identified as the operative eye and it was prepped and draped in the usual sterile ophthalmic fashion.   A 1 millimeter clear-corneal paracentesis was made at the 12:00 position.  0.5 ml of preservative-free 1% lidocaine was injected into the anterior chamber. The anterior chamber was filled with Viscoat viscoelastic.  A 2.4 millimeter keratome was used to make a near-clear corneal incision at the 9:00 position.  A curvilinear capsulorrhexis was made with a cystotome and capsulorrhexis forceps.  Balanced salt solution was used to hydrodissect and hydrodelineate the nucleus.   Phacoemulsification was then used in stop and chop fashion to remove the lens nucleus and epinucleus.  The remaining cortex was then removed using the irrigation and aspiration handpiece. Provisc was then placed into the capsular bag to distend it for lens placement.  A lens was then injected into the  capsular bag.  The remaining viscoelastic was aspirated.   Wounds were hydrated with balanced salt solution.  The anterior chamber was inflated to a physiologic pressure with balanced salt solution.  No wound leaks were noted. Cefuroxime 0.1 ml of a 10mg /ml solution was injected into the anterior chamber for a dose of 1 mg of intracameral antibiotic at the completion of the case.   Timolol and Brimonidine drops were applied to the eye.  The patient was taken to the recovery room in stable condition without complications of anesthesia or surgery.   Lauraine Crespo 12/05/2023, 10:55 AM

## 2023-12-05 NOTE — Transfer of Care (Signed)
 Immediate Anesthesia Transfer of Care Note  Patient: Douglas Figueroa  Procedure(s) Performed: CATARACT EXTRACTION PHACO AND INTRAOCULAR LENS PLACEMENT (IOC) RIGHT (Right: Eye)  Patient Location: PACU  Anesthesia Type: MAC  Level of Consciousness: awake, alert  and patient cooperative  Airway and Oxygen Therapy: Patient Spontanous Breathing and Patient connected to supplemental oxygen  Post-op Assessment: Post-op Vital signs reviewed, Patient's Cardiovascular Status Stable, Respiratory Function Stable, Patent Airway and No signs of Nausea or vomiting  Post-op Vital Signs: Reviewed and stable  Complications: No notable events documented.

## 2023-12-06 ENCOUNTER — Encounter: Payer: Self-pay | Admitting: Ophthalmology
